# Patient Record
Sex: Male | Born: 1995 | Race: White | Hispanic: No | Marital: Married | Smoking: Never smoker
Health system: Southern US, Community
[De-identification: ages and names within clinical notes are randomized; demographics above are authoritative.]

## PROBLEM LIST (undated history)

## (undated) DIAGNOSIS — J45909 Unspecified asthma, uncomplicated: Secondary | ICD-10-CM

---
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
## 2016-08-26 ENCOUNTER — Other Ambulatory Visit: Payer: Self-pay | Admitting: Internal Medicine

## 2016-08-26 DIAGNOSIS — N5089 Other specified disorders of the male genital organs: Secondary | ICD-10-CM

## 2016-08-29 ENCOUNTER — Ambulatory Visit
Admission: RE | Admit: 2016-08-29 | Discharge: 2016-08-29 | Disposition: A | Discharge status: Home / Self Care | Payer: 59 | Source: Ambulatory Visit | Attending: Internal Medicine | Admitting: Internal Medicine

## 2016-08-29 DIAGNOSIS — N5089 Other specified disorders of the male genital organs: Secondary | ICD-10-CM

## 2016-11-19 ENCOUNTER — Ambulatory Visit (INDEPENDENT_AMBULATORY_CARE_PROVIDER_SITE_OTHER): Payer: 59 | Admitting: Physician Assistant

## 2016-11-19 ENCOUNTER — Encounter: Payer: Self-pay | Admitting: Physician Assistant

## 2016-11-19 VITALS — BP 115/70 | HR 72 | Temp 98.2°F | Resp 16 | Ht 73.0 in | Wt 205.2 lb

## 2016-11-19 DIAGNOSIS — R070 Pain in throat: Secondary | ICD-10-CM | POA: Diagnosis not present

## 2016-11-19 LAB — POCT RAPID STREP A (OFFICE): Rapid Strep A Screen: NEGATIVE

## 2016-11-19 MED ORDER — CETIRIZINE HCL 10 MG PO TABS: 10.0000 mg | ORAL_TABLET | Freq: Every day | ORAL | 0 refills | Status: AC

## 2016-11-19 NOTE — Progress Notes (Signed)
PRIMARY CARE AT [facility] [address], [place] [id number] [phone]  Date:  11/19/2016   Name:  Noah Armstrong   DOB:  08/19/1995   MRN:  [id number]  PCP:  Carlean PurlBrett, Charles, MD    History of Present Illness:  Noah Armstrong is a [age] y.o. male patient who presents to PCP with  Chief Complaint  Patient presents with  . throat pain    x 1 wk     Aching, and scratchy throat.  No fatigue.  Aching intermittently along the throat.  No difficulty with swallowing.  No fever.  He had some nasal congestion this morning.  Coughing is very little.  No sob or dyspnea.  [employer].  No hx of mono.   No sneezing.  He does have some watery eyes.; He has taken nothing for his symptoms.    Patient Active Problem List   Diagnosis Date Noted  . Gynecomastia 07/26/2013  . Scoliosis 07/11/2011    No past medical history on file.  No past surgical history on file.  Social History  Substance Use Topics  . Smoking status: Never Smoker  . Smokeless tobacco: Never Used  . Alcohol use No    No family history on file.  No Known Allergies  Medication list has been reviewed and updated.  No current outpatient prescriptions on file prior to visit.   No current facility-administered medications on file prior to visit.     ROS ROS otherwise unremarkable unless listed above.  Physical Examination: BP 115/70   Pulse 72   Temp 98.2 F (36.8 C) (Oral)   Resp 16   Ht 6\' 1"  (1.854 m)   Wt 205 lb 3.2 oz (93.1 kg)   SpO2 96%   BMI 27.07 kg/m  Ideal Body Weight: Weight in (lb) to have BMI = 25: 189.1  Physical Exam  Constitutional: He is oriented to person, place, and time. He appears well-developed and well-nourished. No distress.  HENT:  Head: Atraumatic.  Right Ear: Tympanic membrane, external ear and ear canal normal.  Left Ear: Tympanic membrane, external ear and ear canal normal.  Nose: Mucosal edema and rhinorrhea present. Right sinus exhibits no maxillary sinus  tenderness and no frontal sinus tenderness. Left sinus exhibits no maxillary sinus tenderness and no frontal sinus tenderness.  Mouth/Throat: No uvula swelling. No oropharyngeal exudate, posterior oropharyngeal edema or posterior oropharyngeal erythema.  Eyes: Conjunctivae, EOM and lids are normal. Pupils are equal, round, and reactive to light. Right eye exhibits normal extraocular motion. Left eye exhibits normal extraocular motion.  Neck: Trachea normal and full passive range of motion without pain. No edema and no erythema present.  Cardiovascular: Normal rate.   Pulmonary/Chest: Effort normal. No respiratory distress. He has no decreased breath sounds. He has no wheezes. He has no rhonchi.  Neurological: He is alert and oriented to person, place, and time.  Skin: Skin is warm and dry. He is not diaphoretic.  Psychiatric: He has a normal mood and affect. His behavior is normal.     Assessment and Plan: Noah Armstrong is a 21 y.o. male who is here today for cc of throat pain.  Throat pain in adult - Plan: POCT rapid strep A, Culture, Group A Strep, cetirizine (ZYRTEC) 10 MG tablet  Trena PlattStephanie Farzad Tibbetts, PA-C [facility] 7/6/20189:04 AM

## 2016-11-19 NOTE — Patient Instructions (Addendum)
Increase hydration to 64oz per day.  You can try taking the ibuprofen 600mg  every 6 hours, for 24 hours.   If your symptoms are not improving over the next 3 days, contact me and I will issue an antibiotic.  And if the symptoms are not resolving after one week after that, we can refer to ear nose and throat, as next process.     IF you received an x-ray today, you will receive an invoice from [facility]. Please contact [facility] at [phone] with questions or concerns regarding your invoice.   IF you received labwork today, you will receive an invoice from [facility]. Please contact [facility] at [phone] with questions or concerns regarding your invoice.   Our billing staff will not be able to assist you with questions regarding bills from these companies.  You will be contacted with the lab results as soon as they are available. The fastest way to get your results is to activate your My Chart account. Instructions are located on the last page of this paperwork. If you have not heard from us regarding the results in 2 weeks, please contact this office.

## 2016-11-21 LAB — CULTURE, GROUP A STREP: STREP A CULTURE: NEGATIVE

## 2017-08-18 ENCOUNTER — Encounter: Payer: Self-pay | Admitting: Physician Assistant

## 2017-12-23 IMAGING — CR DG ANKLE COMPLETE 3+V*L*
2 series · 2 of 2 positions shown · non-contrast
Comparison: None.

CLINICAL DATA: 20-year-old male with a history of ankle injury

EXAM:
LEFT ANKLE COMPLETE - 3+ VIEW

[AP]
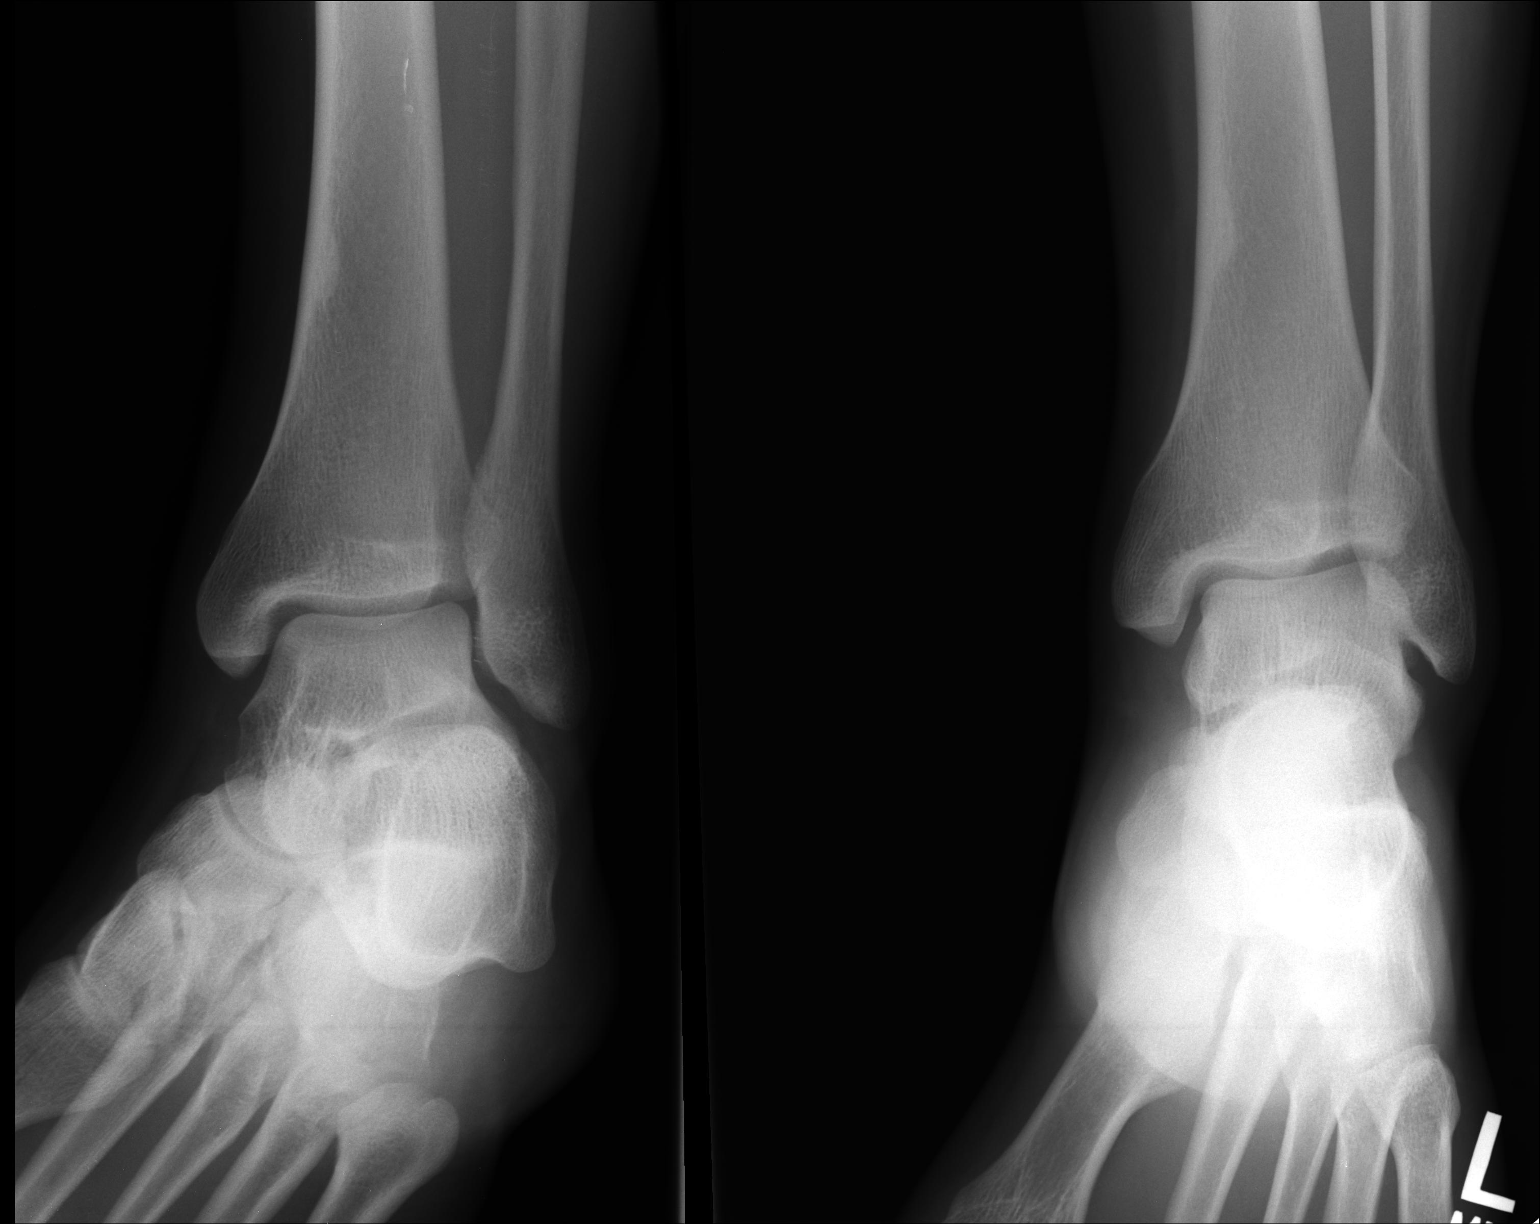

[lateral]
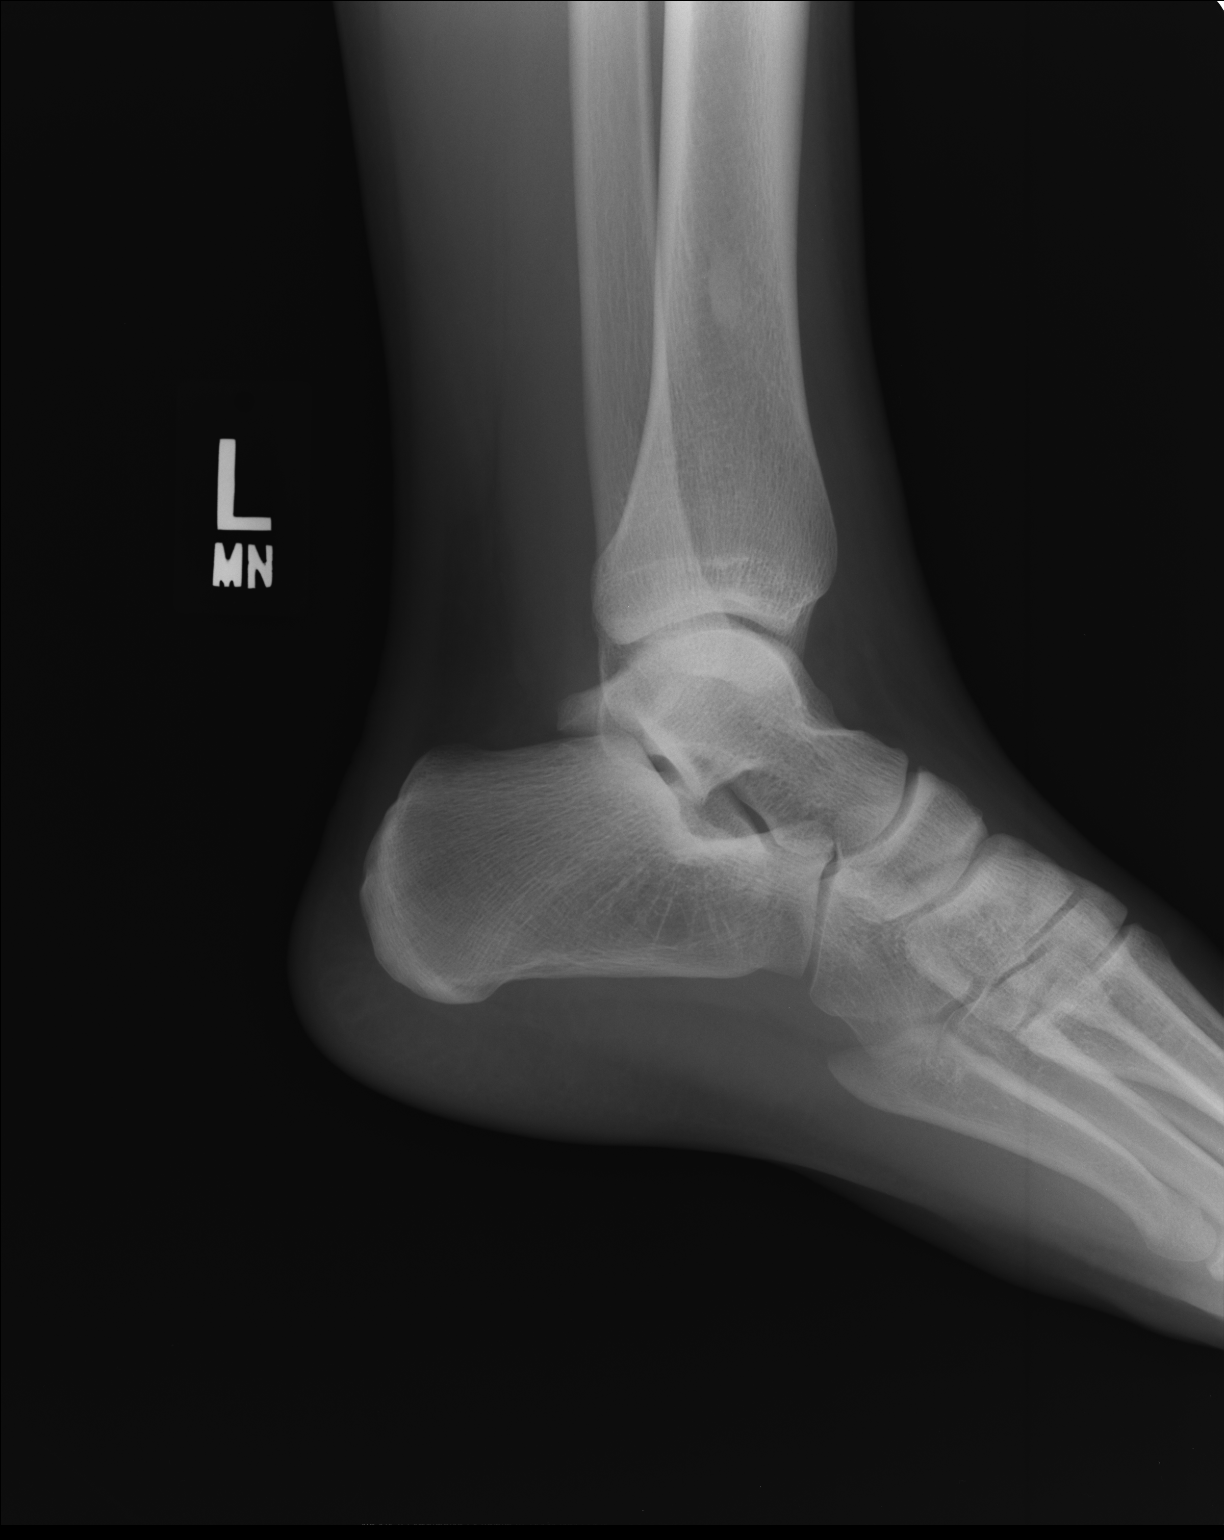

[2 of 2 positions shown; findings below may reference images not displayed]

FINDINGS: There is no evidence of fracture, dislocation, or joint effusion. No
radiopaque foreign body. No significant soft tissue swelling. Dense
bone at the endosteum of the distal tibia with benign features, most
likely bone island or fibrous lesion.
IMPRESSION: Negative for acute bony abnormality.

## 2018-10-23 IMAGING — US US SCROTUM
1 series · 14 of 25 positions shown · non-contrast
Comparison: None.

CLINICAL DATA: Initial evaluation for possible right testicular
mass.

EXAM:
ULTRASOUND OF SCROTUM
TECHNIQUE: Complete ultrasound examination of the testicles, epididymis, and
other scrotal structures was performed.

[Series 1: us scrotum · 0.06mm/px · 14 of 53 slices shown]
[im 1/53]
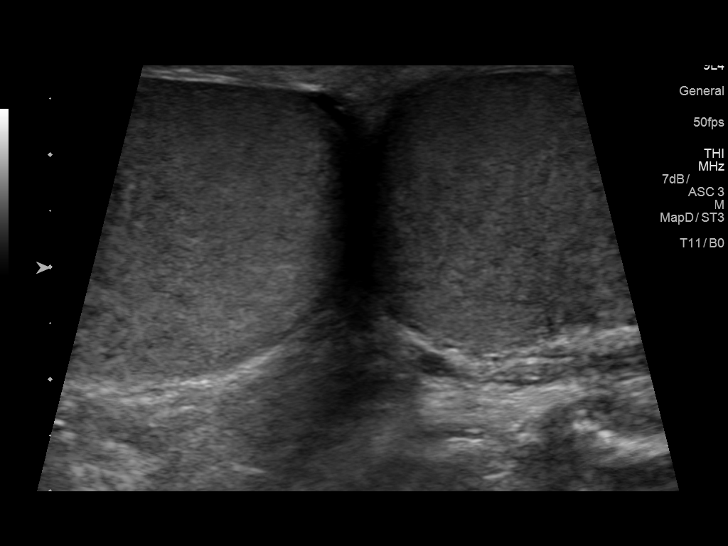
[im 5/53]
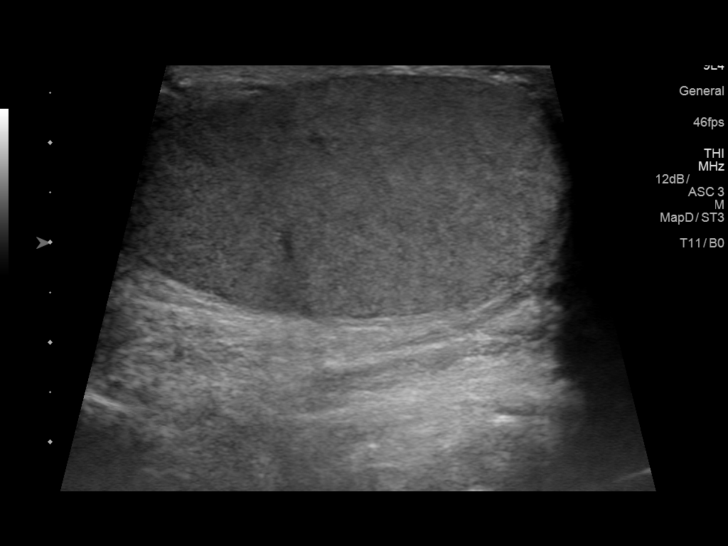
[im 9/53]
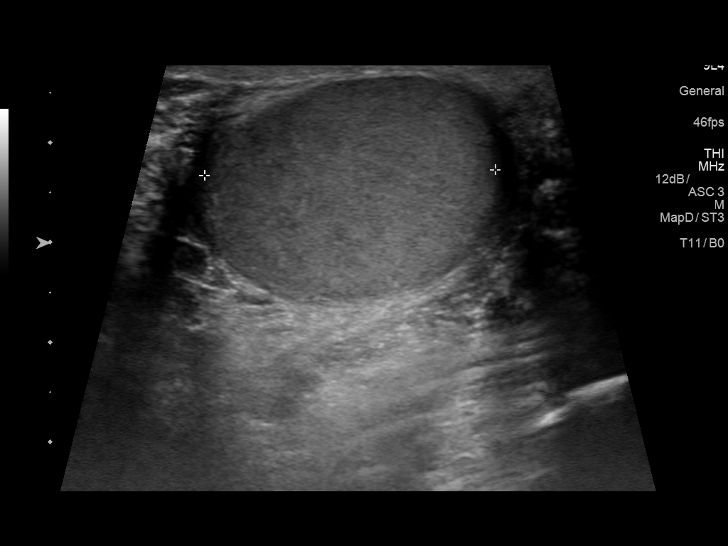
[im 14/53]
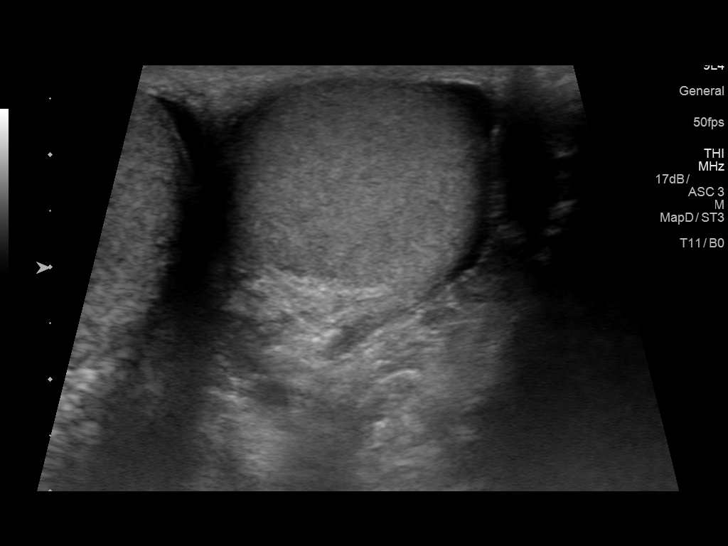
[im 18/53]
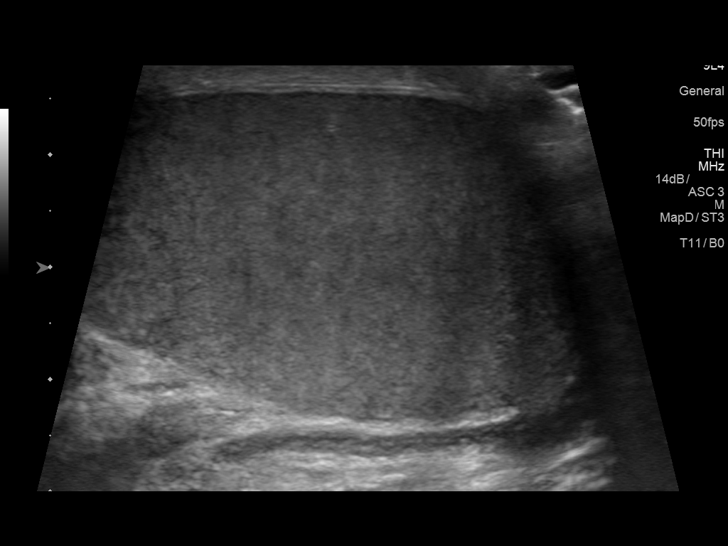
[im 20/53]
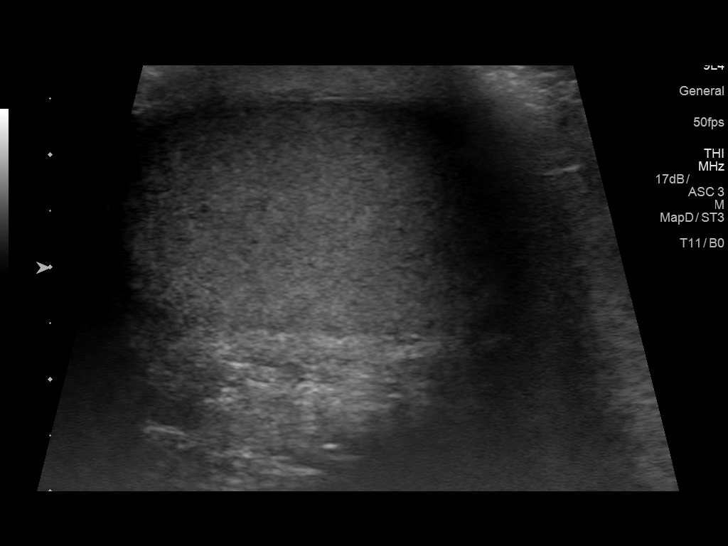
[im 24/53]
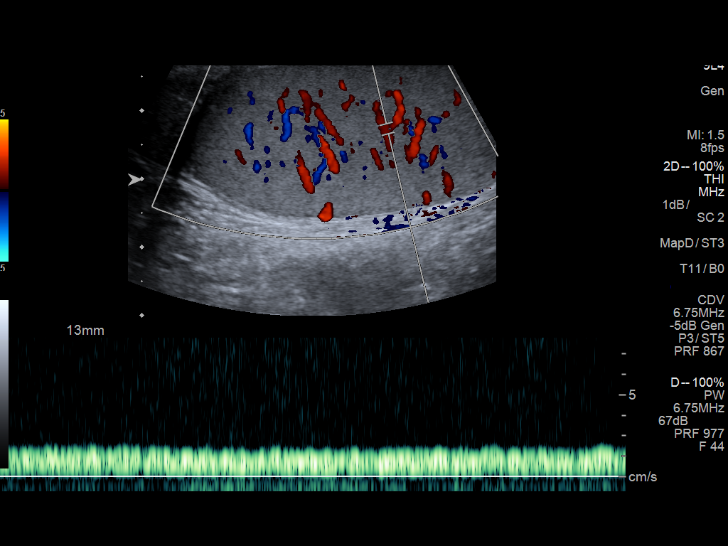
[im 29/53]
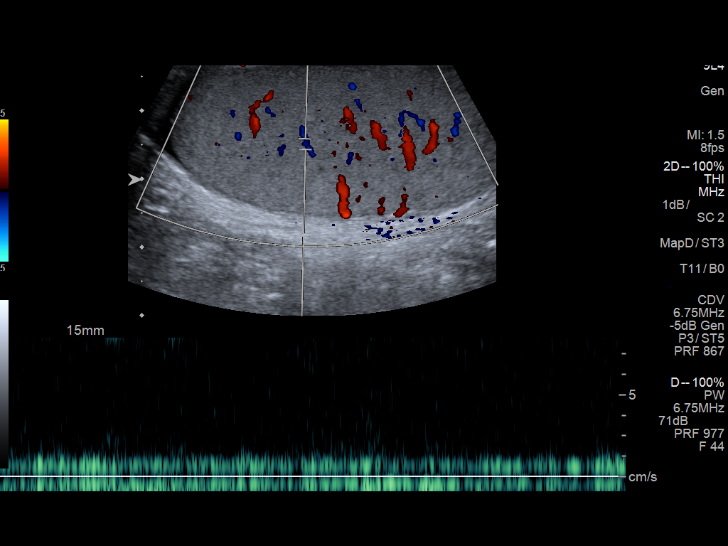
[im 33/53]
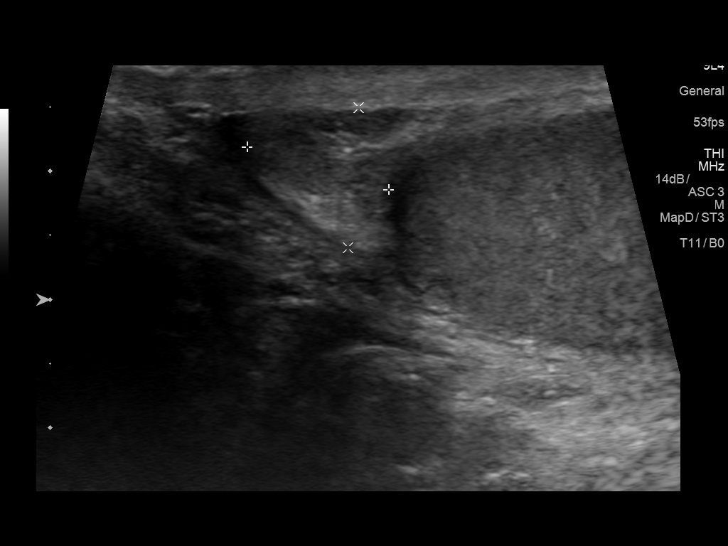
[im 35/53]
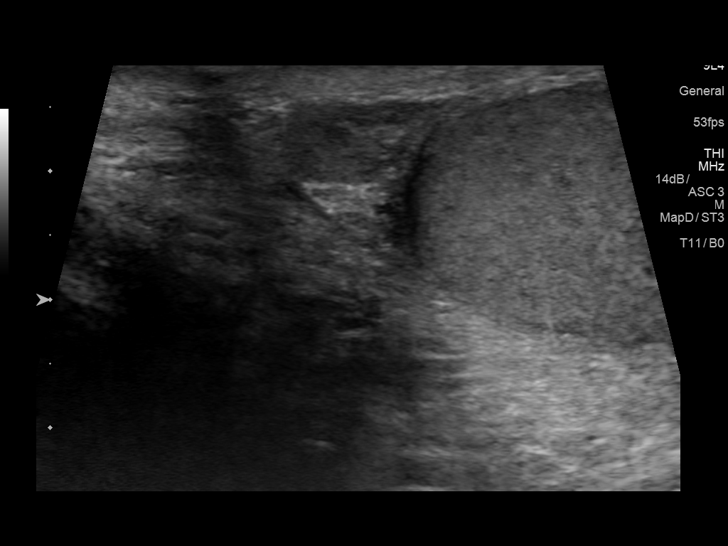
[im 40/53]
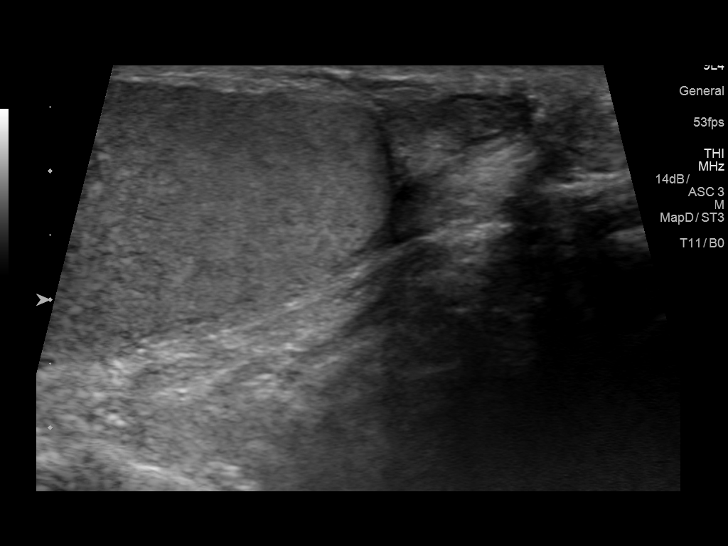
[im 44/53]
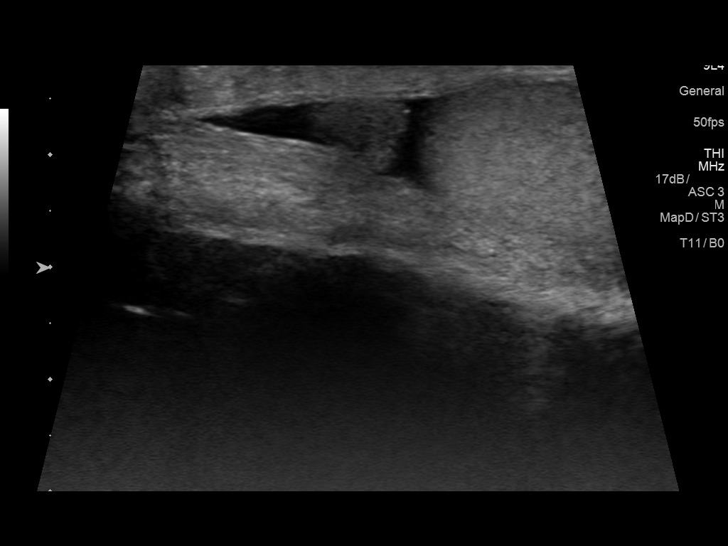
[im 48/53]
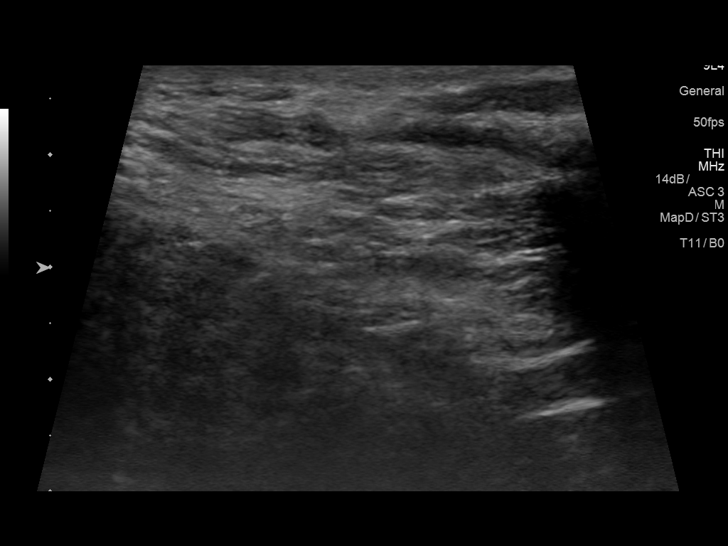
[im 53/53]
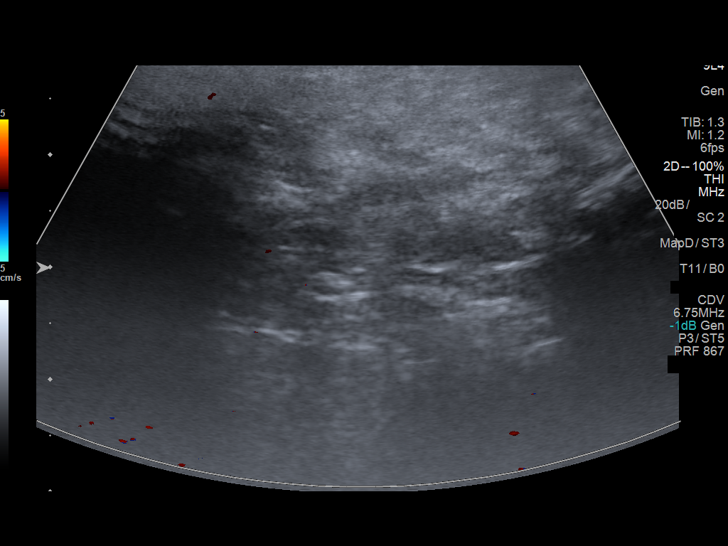

[14 of 25 positions shown; findings below may reference images not displayed]

FINDINGS: Right testicle

Measurements: 4.5 x 2.6 x 2.9 cm. No mass or microlithiasis
visualized.

Left testicle

Measurements: 4.8 x 2.8 x 2.9 cm. No mass or microlithiasis
visualized.

Right epididymis:  Normal in size and appearance.

Left epididymis:  Normal in size and appearance.

Hydrocele:  None visualized.

Varicocele:  None visualized.
IMPRESSION: Negative. No evidence for testicular mass or other significant
abnormality.

## 2023-02-25 ENCOUNTER — Other Ambulatory Visit: Payer: Self-pay

## 2023-02-25 ENCOUNTER — Emergency Department (HOSPITAL_BASED_OUTPATIENT_CLINIC_OR_DEPARTMENT_OTHER)
Admission: EM | Admit: 2023-02-25 | Discharge: 2023-02-25 | Disposition: A | Discharge status: Home / Self Care | Payer: 59 | Attending: Emergency Medicine | Admitting: Emergency Medicine

## 2023-02-25 ENCOUNTER — Encounter (HOSPITAL_BASED_OUTPATIENT_CLINIC_OR_DEPARTMENT_OTHER): Payer: Self-pay | Admitting: Emergency Medicine

## 2023-02-25 DIAGNOSIS — K5900 Constipation, unspecified: Secondary | ICD-10-CM | POA: Insufficient documentation

## 2023-02-25 DIAGNOSIS — R112 Nausea with vomiting, unspecified: Secondary | ICD-10-CM | POA: Diagnosis present

## 2023-02-25 DIAGNOSIS — A084 Viral intestinal infection, unspecified: Secondary | ICD-10-CM | POA: Insufficient documentation

## 2023-02-25 LAB — URINALYSIS, ROUTINE W REFLEX MICROSCOPIC
Bilirubin Urine: NEGATIVE
Glucose, UA: NEGATIVE mg/dL
Hgb urine dipstick: NEGATIVE
Ketones, ur: NEGATIVE mg/dL
Leukocytes,Ua: NEGATIVE
Nitrite: NEGATIVE
Protein, ur: NEGATIVE mg/dL
Specific Gravity, Urine: 1.005 (ref 1.005–1.030)
pH: 7 (ref 5.0–8.0)

## 2023-02-25 LAB — COMPREHENSIVE METABOLIC PANEL
ALT: 12 U/L (ref 0–44)
AST: 14 U/L — ABNORMAL LOW (ref 15–41)
Albumin: 5.1 g/dL — ABNORMAL HIGH (ref 3.5–5.0)
Alkaline Phosphatase: 67 U/L (ref 38–126)
Anion gap: 8 (ref 5–15)
BUN: 13 mg/dL (ref 6–20)
CO2: 29 mmol/L (ref 22–32)
Calcium: 9.9 mg/dL (ref 8.9–10.3)
Chloride: 102 mmol/L (ref 98–111)
Creatinine, Ser: 1.12 mg/dL (ref 0.61–1.24)
GFR, Estimated: >60 mL/min (ref >60)
Glucose, Bld: 94 mg/dL (ref 70–99)
Potassium: 4.1 mmol/L (ref 3.5–5.1)
Sodium: 139 mmol/L (ref 135–145)
Total Bilirubin: 0.9 mg/dL (ref 0.3–1.2)
Total Protein: 8.1 g/dL (ref 6.5–8.1)

## 2023-02-25 LAB — CBC
HCT: 40.6 % (ref 39.0–52.0)
Hemoglobin: 14.6 g/dL (ref 13.0–17.0)
MCH: 31 pg (ref 26.0–34.0)
MCHC: 36 g/dL (ref 30.0–36.0)
MCV: 86.2 fL (ref 80.0–100.0)
Platelets: 242 10*3/uL (ref 150–400)
RBC: 4.71 MIL/uL (ref 4.22–5.81)
RDW: 11.9 % (ref 11.5–15.5)
WBC: 9.6 10*3/uL (ref 4.0–10.5)
nRBC: 0 % (ref 0.0–0.2)

## 2023-02-25 LAB — LIPASE, BLOOD: Lipase: 15 U/L (ref 11–51)

## 2023-02-25 MED ORDER — METOCLOPRAMIDE HCL 10 MG PO TABS
10.0000 mg | ORAL_TABLET | Freq: Once | ORAL | Status: AC
  Administered 2023-02-25: 10 mg via ORAL
  Filled 2023-02-25: qty 1

## 2023-02-25 MED ORDER — SUCRALFATE 1 G PO TABS: 1.0000 g | ORAL_TABLET | Freq: Three times a day (TID) | ORAL | 1 refills | Status: AC

## 2023-02-25 MED ORDER — POLYETHYLENE GLYCOL 3350 17 G PO PACK: 17.0000 g | PACK | Freq: Every day | ORAL | 0 refills | Status: AC

## 2023-02-25 MED ORDER — METOCLOPRAMIDE HCL 10 MG PO TABS: 10.0000 mg | ORAL_TABLET | Freq: Four times a day (QID) | ORAL | 1 refills | Status: AC

## 2023-02-25 NOTE — ED Provider Notes (Signed)
[place] EMERGENCY DEPARTMENT AT [facility] Provider Note   CSN: [id number] Arrival date & time: 02/25/23  0865     History  Chief Complaint  Patient presents with   Abdominal Pain    Noah Armstrong is a 27 y.o. male.  27 year old otherwise healthy male with no past medical history presenting to the ED for evaluation of 2 weeks of nausea that is resistant to treatment with Zofran.  He did also experience new sharp lower abdominal pain last night that would come and go as well as an evening of nonbloody nonbilious vomiting 1 week ago that has since resolved.  He does have a known sick contact, his father, who was ill with gastritis like symptoms about 3 weeks ago.  Patient has sought ER workup in the context of resistant symptoms and the new sharp lower abdominal pain.  Pain ranges from a 4/10 to a 7/10.  He does admit to constipation, with bowel movements that are very hard about every 3 days.  He denies fever, chills, dizziness, diarrhea, recent emesis, anorexia.   Abdominal Pain Associated symptoms: constipation, nausea and vomiting   Associated symptoms: no chest pain, no chills, no cough, no diarrhea, no dysuria, no fatigue, no fever and no shortness of breath        Home Medications Prior to Admission medications   Medication Sig Start Date End Date Taking? Authorizing Provider  metoCLOPramide (REGLAN) 10 MG tablet Take 1 tablet (10 mg total) by mouth every 6 (six) hours. 02/25/23  Yes Eldwin Volkov, DO  polyethylene glycol (MIRALAX) 17 g packet Take 17 g by mouth daily. 02/25/23  Yes Lexia Vandevender, DO  sucralfate (CARAFATE) 1 g tablet Take 1 tablet (1 g total) by mouth 4 (four) times daily -  with meals and at bedtime. 02/25/23  Yes Katheran James, DO  cetirizine (ZYRTEC) 10 MG tablet Take 1 tablet (10 mg total) by mouth daily. 11/19/16   Trena Platt D, PA      Allergies    Patient has no known allergies.    Review of Systems   Review of  Systems  Constitutional:  Negative for chills, fatigue and fever.  Respiratory:  Negative for cough, chest tightness and shortness of breath.   Cardiovascular:  Negative for chest pain and palpitations.  Gastrointestinal:  Positive for abdominal pain, constipation, nausea and vomiting. Negative for blood in stool and diarrhea.  Genitourinary:  Negative for dysuria.  Musculoskeletal:  Negative for back pain and myalgias.  Neurological:  Negative for dizziness, light-headedness and headaches.    Physical Exam Updated Vital Signs BP 135/88   Pulse 99   Temp 98.7 F (37.1 C) (Oral)   Resp 18   Ht 6\' 2"  (1.88 m)   Wt 98.4 kg   SpO2 98%   BMI 27.86 kg/m  Physical Exam Constitutional:      Appearance: He is well-developed. He is not ill-appearing.  Cardiovascular:     Rate and Rhythm: Normal rate and regular rhythm.  Pulmonary:     Effort: Pulmonary effort is normal.     Breath sounds: Normal breath sounds.  Abdominal:     General: Abdomen is flat and scaphoid. Bowel sounds are normal. There is no distension.     Palpations: There is no mass.     Tenderness: There is generalized abdominal tenderness.  Neurological:     General: No focal deficit present.     Mental Status: He is alert and oriented to person, place, and  time.  Psychiatric:        Mood and Affect: Mood normal.        Behavior: Behavior normal.     ED Results / Procedures / Treatments   Labs (all labs ordered are listed, but only abnormal results are displayed) Labs Reviewed  COMPREHENSIVE METABOLIC PANEL - Abnormal; Notable for the following components:      Result Value   Albumin 5.1 (*)    AST 14 (*)    All other components within normal limits  URINALYSIS, ROUTINE W REFLEX MICROSCOPIC - Abnormal; Notable for the following components:   Color, Urine COLORLESS (*)    All other components within normal limits  LIPASE, BLOOD  CBC    EKG None  Radiology No results found.  Procedures Procedures     Medications Ordered in ED Medications  metoCLOPramide (REGLAN) tablet 10 mg (has no administration in time range)    ED Course/ Medical Decision Making/ A&P                                 Medical Decision Making This patient has no past medical history and he has dealt with nausea x 2 weeks with 1 night of vomiting 1 week ago and now constipation.  He has a known sick contact, his father, who also had similar symptoms.  Laboratory workup is unremarkable, he does not have a white count, lipase is normal, electrolytes are normal.  On exam he does have mild tenderness that is diffuse.  He is tolerating a diet, he does not have intractable vomiting, he has witnessed no blood, his vitals are stable.  We discussed alternative medicines to control his symptoms, and he is agreeable to head home with prescriptions for Reglan, Carafate, and MiraLAX.  Amount and/or Complexity of Data Reviewed Labs: ordered.  Risk OTC drugs. Prescription drug management.          Final Clinical Impression(s) / ED Diagnoses Final diagnoses:  Nausea and vomiting, unspecified vomiting type  Viral gastroenteritis    Rx / DC Orders ED Discharge Orders          Ordered    sucralfate (CARAFATE) 1 g tablet  3 times daily with meals & bedtime        02/25/23 1149    metoCLOPramide (REGLAN) 10 MG tablet  Every 6 hours        02/25/23 1149    polyethylene glycol (MIRALAX) 17 g packet  Daily        02/25/23 1151              Katheran James, DO 02/25/23 1202    Virgina Norfolk, DO 02/25/23 1423

## 2023-02-25 NOTE — ED Triage Notes (Signed)
Nausea x 2 weeks. Has seen PCP and given zofran and protonix with no relief. States he was at work last night and had sharp mid abd pain.

## 2023-02-25 NOTE — Discharge Instructions (Addendum)
Noah Armstrong, Fortunately your lab workup does not reveal any issues that would require hospitalization. Overall, your illness appears viral. Your sharp stomach pains are likely related to underlying constipation. We will try different medicines to manage your symptoms better. Reglan is for nausea. Carafate is for acid/stomach healing. Miralax is to improve constipation.

## 2023-02-25 NOTE — ED Notes (Signed)
Discharge paperwork given and verbally understood. 

## 2023-02-25 NOTE — ED Provider Notes (Signed)
I have personally seen and examined the patient. I have reviewed the documentation on PMH/FH/Soc Hx. I have discussed the plan of care with the resident and patient.  I have reviewed and agree with the resident's documentation. Please see associated encounter note.  Briefly, the patient is a 27 y.o. male here with nausea time.  He has been on Protonix and Zofran with minimal help.  Not having any abdominal pain now.  Well-appearing.  No abdominal tenderness.  Differential diagnosis gastritis versus less likely pancreatitis or cholecystitis.  Could be biliary colic.  Labs were obtained that showed no significant anemia or electrolyte abnormality or kidney injury or leukocytosis.  Pancreas enzyme lipase is normal.  Gallbladder and liver enzymes within normal limits.  Clinically have no suspicion for cholecystitis otherwise.  I suspect some stomach inflammation.  Will add Carafate to his Protonix.  Give him Reglan.  Have him follow-up with primary care doctor and GI.  He is not having any black or bloody stools.  He is not having any fevers.  Understands return precautions.  Discharged in good condition.  This chart was dictated using voice recognition software.  Despite best efforts to proofread,  errors can occur which can change the documentation meaning.    EKG Interpretation None          Virgina Norfolk, DO 02/25/23 1158

## 2023-06-28 ENCOUNTER — Encounter (HOSPITAL_BASED_OUTPATIENT_CLINIC_OR_DEPARTMENT_OTHER): Payer: Self-pay

## 2023-06-28 ENCOUNTER — Emergency Department (HOSPITAL_BASED_OUTPATIENT_CLINIC_OR_DEPARTMENT_OTHER)
Admission: EM | Admit: 2023-06-28 | Discharge: 2023-06-28 | Disposition: A | Discharge status: Home / Self Care | Payer: 59 | Attending: Emergency Medicine | Admitting: Emergency Medicine

## 2023-06-28 ENCOUNTER — Emergency Department (HOSPITAL_BASED_OUTPATIENT_CLINIC_OR_DEPARTMENT_OTHER): Payer: 59

## 2023-06-28 ENCOUNTER — Other Ambulatory Visit: Payer: Self-pay

## 2023-06-28 DIAGNOSIS — Z79899 Other long term (current) drug therapy: Secondary | ICD-10-CM | POA: Diagnosis not present

## 2023-06-28 DIAGNOSIS — J45909 Unspecified asthma, uncomplicated: Secondary | ICD-10-CM | POA: Insufficient documentation

## 2023-06-28 DIAGNOSIS — Z20822 Contact with and (suspected) exposure to covid-19: Secondary | ICD-10-CM | POA: Insufficient documentation

## 2023-06-28 DIAGNOSIS — R051 Acute cough: Secondary | ICD-10-CM

## 2023-06-28 DIAGNOSIS — R0602 Shortness of breath: Secondary | ICD-10-CM | POA: Diagnosis present

## 2023-06-28 HISTORY — DX: Unspecified asthma, uncomplicated: J45.909

## 2023-06-28 LAB — RESP PANEL BY RT-PCR (RSV, FLU A&B, COVID)  RVPGX2
Influenza A by PCR: NEGATIVE
Influenza B by PCR: NEGATIVE
Resp Syncytial Virus by PCR: NEGATIVE
SARS Coronavirus 2 by RT PCR: NEGATIVE

## 2023-06-28 MED ORDER — PREDNISONE 20 MG PO TABS: 60.0000 mg | ORAL_TABLET | Freq: Every day | ORAL | 0 refills | Status: AC

## 2023-06-28 NOTE — ED Triage Notes (Addendum)
 Patient presents to the ED with c/o shortness of breath +chest tightness, productive cough that started 3 weeks ago. Reports increase usage of albuterol inhaler. Denies fever, chills, nausea, vomiting, sore throat.

## 2023-06-28 NOTE — ED Provider Notes (Signed)
 [place] [facility] AT [facility] Provider Note   CSN: [id number] Arrival date & time: 06/28/23  1210     History  Chief Complaint  Patient presents with   Shortness of Breath   Cough    Noah Armstrong is a 28 y.o. male.  Patient here with cough shortness of breath.  History of reactive airway disease.  Nothing makes it worse or better.  Denies any chest pain shortness of breath weakness numbness tingling.  No recent surgery or travel.  No sore throat.  No blood clot history.  He has had some similar issues here on and off the last few months.  No smoking history.  The history is provided by the patient.       Home Medications Prior to Admission medications   Medication Sig Start Date End Date Taking? Authorizing Provider  predniSONE  (DELTASONE ) 20 MG tablet Take 3 tablets (60 mg total) by mouth daily for 5 days. 06/28/23 07/03/23 Yes Noah Lybeck, DO  cetirizine  (ZYRTEC ) 10 MG tablet Take 1 tablet (10 mg total) by mouth daily. 11/19/16   Noah Krabbe D, PA  metoCLOPramide  (REGLAN ) 10 MG tablet Take 1 tablet (10 mg total) by mouth every 6 (six) hours. 02/25/23   Noah Bruckner, DO  polyethylene glycol (MIRALAX ) 17 g packet Take 17 g by mouth daily. 02/25/23   Noah Bruckner, DO  sucralfate  (CARAFATE ) 1 g tablet Take 1 tablet (1 g total) by mouth 4 (four) times daily -  with meals and at bedtime. 02/25/23   Noah Bruckner, DO      Allergies    Patient has no known allergies.    Review of Systems   Review of Systems  Physical Exam Updated Vital Signs BP (!) 135/90 (BP Location: Right Arm)   Pulse 84   Temp (!) 97.4 F (36.3 C)   Resp 18   SpO2 98%  Physical Exam Vitals and nursing note reviewed.  Constitutional:      General: He is not in acute distress.    Appearance: He is well-developed. He is not ill-appearing.  HENT:     Head: Normocephalic and atraumatic.     Mouth/Throat:     Mouth: Mucous membranes are moist.  Eyes:      Extraocular Movements: Extraocular movements intact.     Conjunctiva/sclera: Conjunctivae normal.     Pupils: Pupils are equal, round, and reactive to light.  Cardiovascular:     Rate and Rhythm: Normal rate and regular rhythm.     Pulses: Normal pulses.     Heart sounds: Normal heart sounds. No murmur heard. Pulmonary:     Effort: Pulmonary effort is normal. No respiratory distress.     Breath sounds: Normal breath sounds. No decreased breath sounds.  Abdominal:     Palpations: Abdomen is soft.     Tenderness: There is no abdominal tenderness.  Musculoskeletal:        General: No swelling. Normal range of motion.     Cervical back: Normal range of motion and neck supple.     Right lower leg: No edema.     Left lower leg: No edema.  Skin:    General: Skin is warm and dry.     Capillary Refill: Capillary refill takes less than 2 seconds.  Neurological:     Mental Status: He is alert.  Psychiatric:        Mood and Affect: Mood normal.     ED Results / Procedures / Treatments  Labs (all labs ordered are listed, but only abnormal results are displayed) Labs Reviewed  RESP PANEL BY RT-PCR (RSV, FLU A&B, COVID)  [id number]    EKG None  Radiology DG Chest Portable 1 View Result Date: 06/28/2023 CLINICAL DATA:  Cough and chest tightness for the past 3 weeks. EXAM: PORTABLE CHEST 1 VIEW COMPARISON:  Chest x-ray dated May 05, 2012. FINDINGS: The heart size and mediastinal contours are within normal limits. Both lungs are clear. The visualized skeletal structures are unremarkable. IMPRESSION: No active disease. Electronically Signed   By: Noah Armstrong M.Armstrong.   On: 06/28/2023 13:22    Procedures Procedures    Medications Ordered in ED Medications - No data to display  ED Course/ Medical Decision Making/ A&P                                 Medical Decision Making Amount and/or Complexity of Data Reviewed Radiology: ordered.  Risk Prescription drug  management.   Noah Armstrong is here with cough.  Normal vitals.  No fever.  Well-appearing.  Clear breath sounds.  Been using inhaler with improvement.  Differential diagnosis likely pneumonia versus reactive airway process.  Could be viral process.  Have no concern for ACS or PE.  Patient's PERC negative.  EKG shows sinus rhythm.  No ischemic changes.  No cardiac risk factors.  No chest pain.  Chest x-ray was obtained that showed no evidence of pneumonia or pneumothorax per my review and interpretation.  COVID and flu RSV test are negative.  Overall I do think that this is a reactive airway process.  Will put him on prednisone  and have him follow-up with primary care.  Will have him follow-up with pulmonology as well.  He is not on any maintenance inhalers and I think he benefit from pulmonary function testing.  Overall I do not have any concern for other emergent process.  He understands return precautions.  Discharge.  This chart was dictated using voice recognition software.  Despite best efforts to proofread,  errors can occur which can change the documentation meaning.         Final Clinical Impression(s) / ED Diagnoses Final diagnoses:  Acute cough  Mild asthma without complication, unspecified whether persistent    Rx / DC Orders ED Discharge Orders          Ordered    predniSONE  (DELTASONE ) 20 MG tablet  Daily        06/28/23 1411              Noah Cornet, DO 06/28/23 1414
